# Patient Record
Sex: Female | Born: 1960 | Race: Black or African American | Hispanic: No | Marital: Married | Smoking: Former smoker
Health system: Southern US, Community
[De-identification: ages and names within clinical notes are randomized; demographics above are authoritative.]

---

## 2008-07-14 ENCOUNTER — Emergency Department: Payer: Self-pay | Admitting: Emergency Medicine

## 2008-10-02 ENCOUNTER — Emergency Department: Payer: Self-pay | Admitting: Emergency Medicine

## 2009-06-18 ENCOUNTER — Ambulatory Visit: Payer: Self-pay | Admitting: Obstetrics and Gynecology

## 2009-07-08 ENCOUNTER — Ambulatory Visit: Payer: Self-pay | Admitting: Obstetrics and Gynecology

## 2009-11-22 IMAGING — CR NECK SOFT TISSUES - 1+ VIEW
1 series · 2 of 2 positions shown · non-contrast
Comparison: none

REASON FOR EXAM: sore throat, difficulty swallowing
COMMENTS:

[Series 1: view not recorded · 0.17mm/px · 2 of 2 slices shown]
[im 1/2]
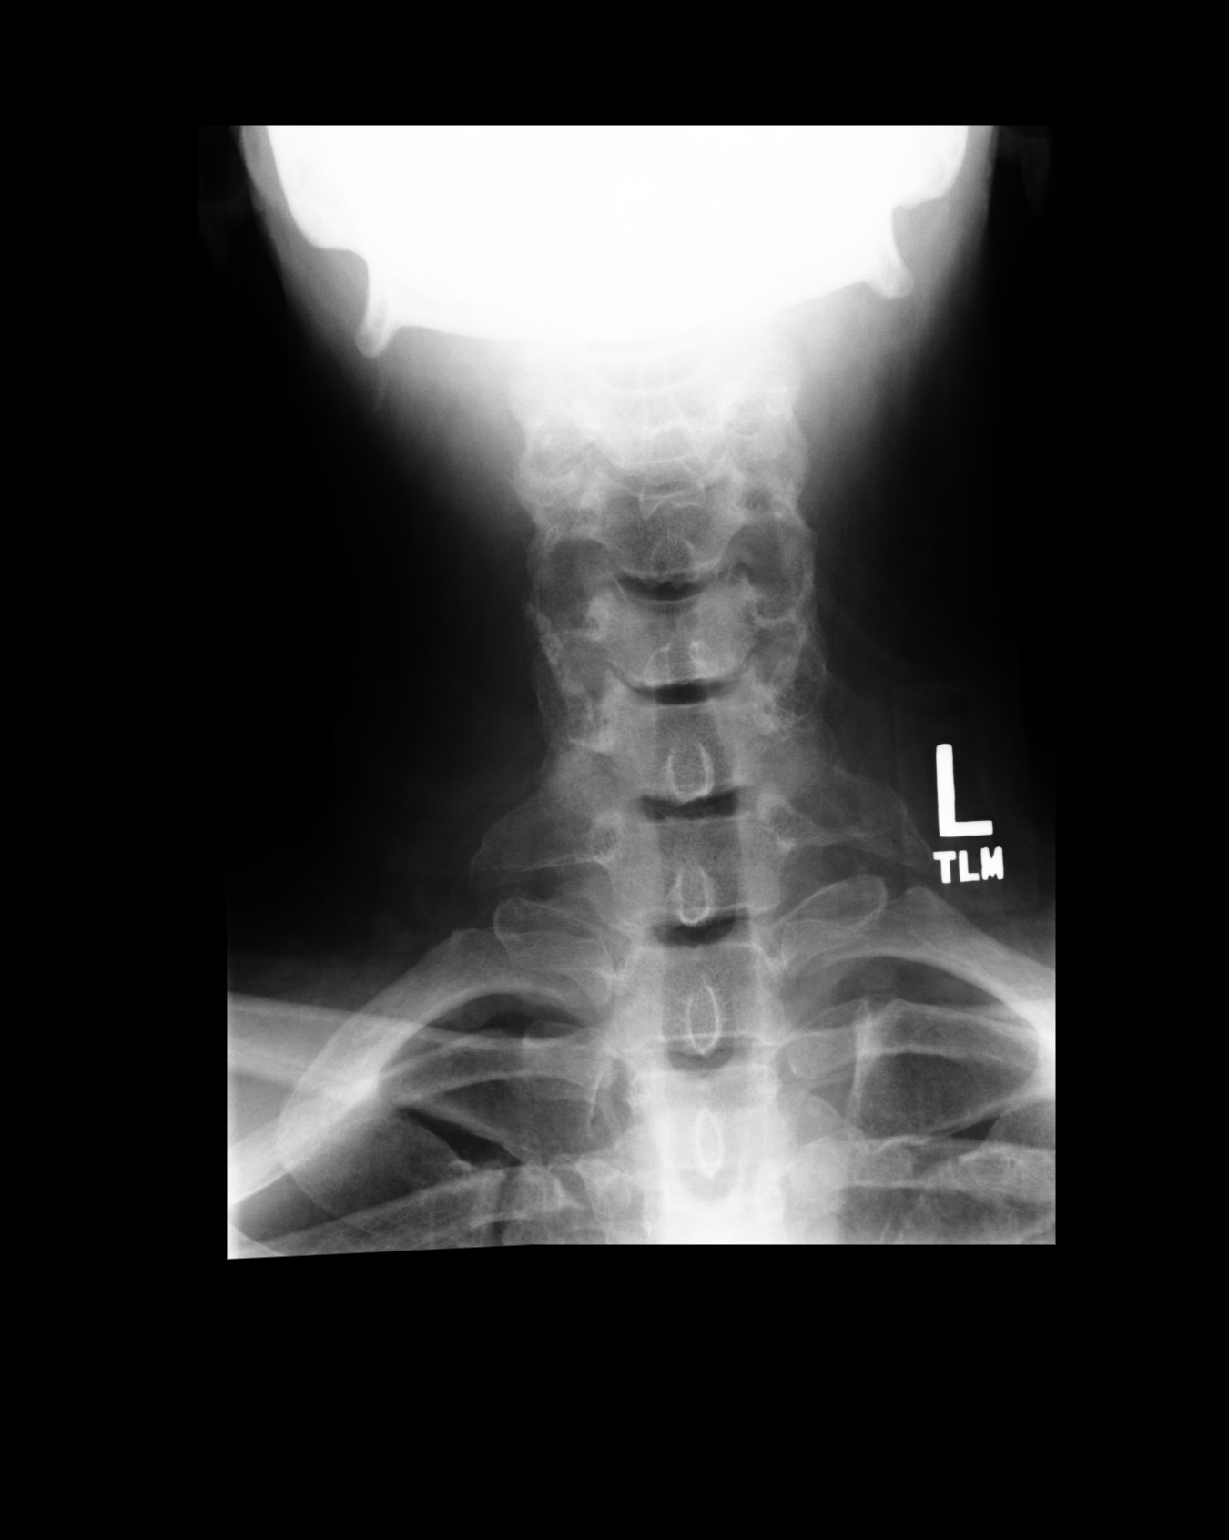
[im 2/2]
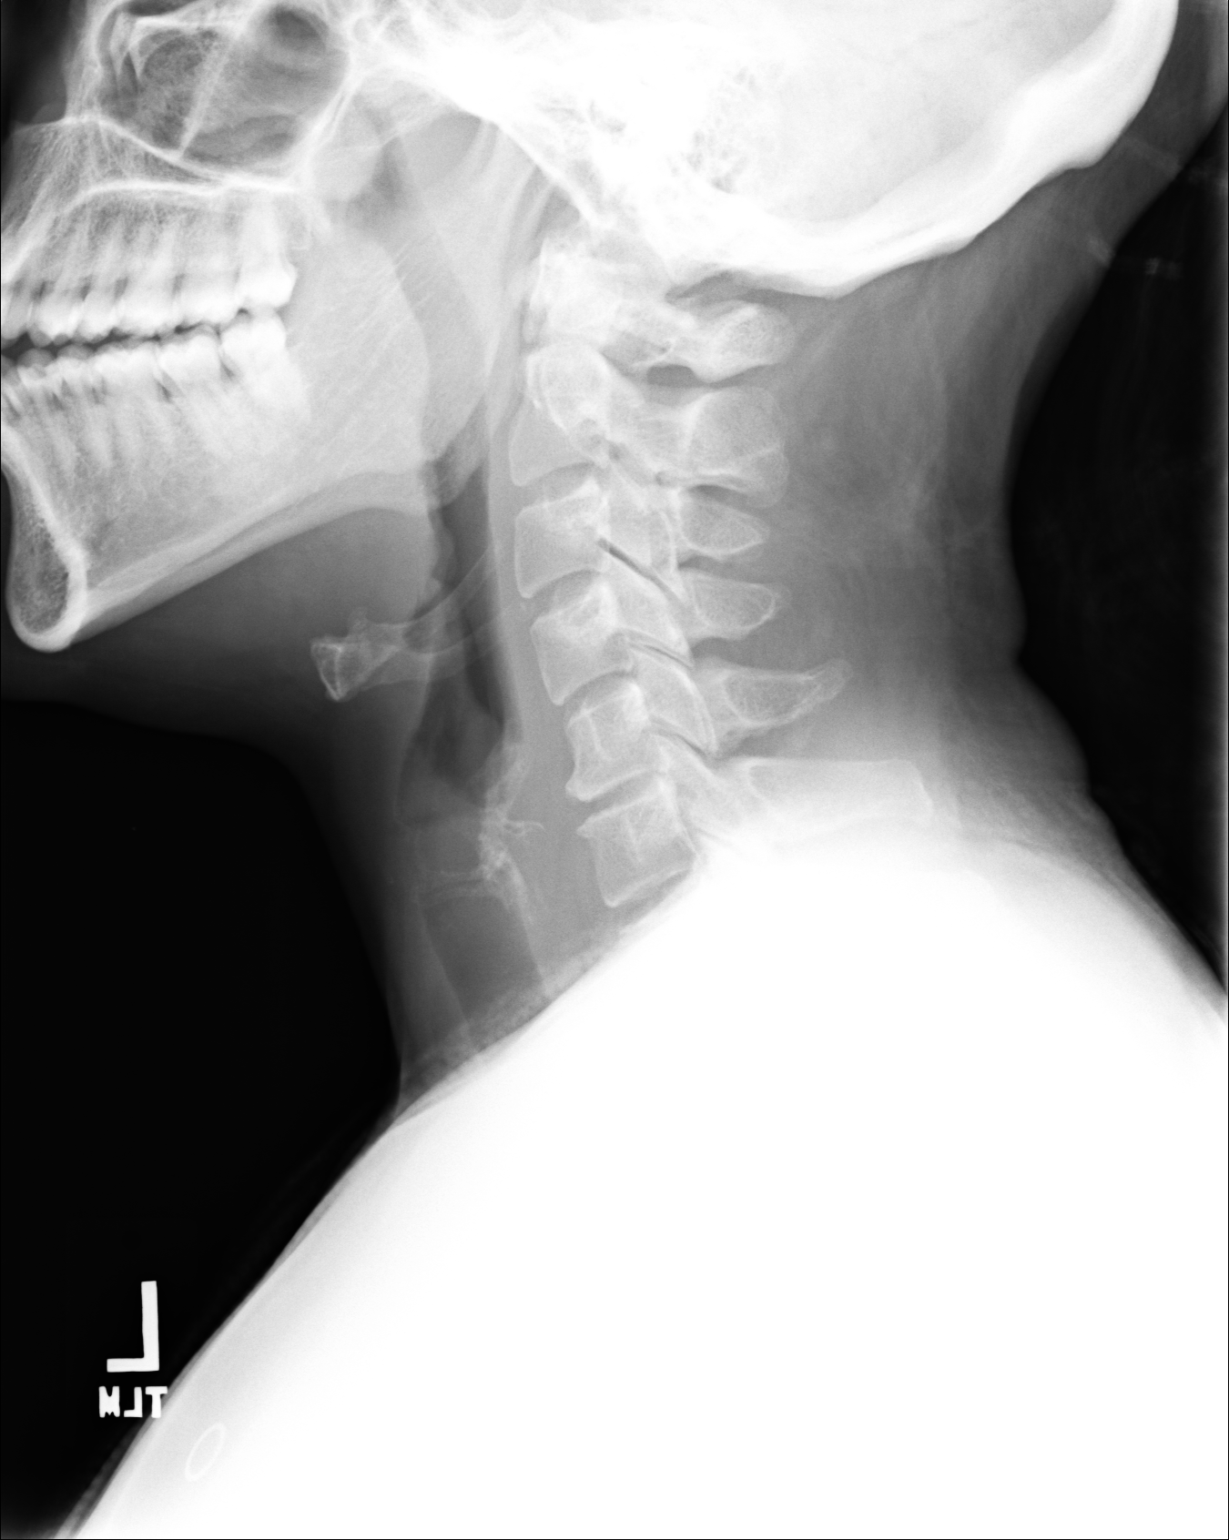

[2 of 2 positions shown; findings below may reference images not displayed]

PROCEDURE:     DXR - DXR SOFT TISSUE NECK  - July 14, 2008  [DATE]

RESULT:     There is no evidence of hypopharyngeal dilation or tracheal
narrowing.  No evidence of soft tissue masses is appreciated.  The
visualized bony skeleton demonstrates no evidence of fracture or
dislocation. There is no evidence of radiopaque foreign bodies.  No evidence
of epiglottical thickening is identified.
IMPRESSION: Unremarkable soft tissue neck evaluation as described above.

## 2011-10-15 ENCOUNTER — Emergency Department: Payer: Self-pay | Admitting: Emergency Medicine

## 2012-11-11 ENCOUNTER — Emergency Department: Payer: Self-pay | Admitting: Emergency Medicine

## 2014-01-13 ENCOUNTER — Emergency Department: Payer: Self-pay | Admitting: Emergency Medicine

## 2014-01-15 LAB — BETA STREP CULTURE(ARMC)

## 2017-12-20 ENCOUNTER — Encounter: Payer: Self-pay | Admitting: Physician Assistant

## 2017-12-20 ENCOUNTER — Emergency Department
Admission: EM | Admit: 2017-12-20 | Discharge: 2017-12-20 | Disposition: A | Discharge status: Home / Self Care | Payer: Self-pay | Attending: Emergency Medicine | Admitting: Emergency Medicine

## 2017-12-20 ENCOUNTER — Emergency Department: Payer: Self-pay

## 2017-12-20 ENCOUNTER — Other Ambulatory Visit: Payer: Self-pay

## 2017-12-20 DIAGNOSIS — Y998 Other external cause status: Secondary | ICD-10-CM | POA: Insufficient documentation

## 2017-12-20 DIAGNOSIS — S43102A Unspecified dislocation of left acromioclavicular joint, initial encounter: Secondary | ICD-10-CM | POA: Insufficient documentation

## 2017-12-20 DIAGNOSIS — Y9389 Activity, other specified: Secondary | ICD-10-CM | POA: Insufficient documentation

## 2017-12-20 DIAGNOSIS — W010XXA Fall on same level from slipping, tripping and stumbling without subsequent striking against object, initial encounter: Secondary | ICD-10-CM | POA: Insufficient documentation

## 2017-12-20 DIAGNOSIS — Y929 Unspecified place or not applicable: Secondary | ICD-10-CM | POA: Insufficient documentation

## 2017-12-20 MED ORDER — MELOXICAM 15 MG PO TABS: 15.0000 mg | ORAL_TABLET | Freq: Every day | ORAL | 2 refills | Status: AC

## 2017-12-20 MED ORDER — IBUPROFEN 800 MG PO TABS
800.0000 mg | ORAL_TABLET | Freq: Once | ORAL | Status: AC
  Administered 2017-12-20: 800 mg via ORAL
  Filled 2017-12-20: qty 1

## 2017-12-20 MED ORDER — TRAMADOL HCL 50 MG PO TABS: 50.0000 mg | ORAL_TABLET | Freq: Four times a day (QID) | ORAL | 0 refills | Status: AC | PRN

## 2017-12-20 MED ORDER — TRAMADOL HCL 50 MG PO TABS
50.0000 mg | ORAL_TABLET | Freq: Once | ORAL | Status: AC
  Administered 2017-12-20: 50 mg via ORAL
  Filled 2017-12-20: qty 1

## 2017-12-20 NOTE — ED Notes (Signed)
Pt requesting if she can have a stronger pain medicine before she leaves.

## 2017-12-20 NOTE — Discharge Instructions (Addendum)
Follow-up with Dr. Joice Lofts, call for an appointment.  Apply ice to the left shoulder as much as possible.  He can wear the sling for comfort but be sure to take your shoulder out of the sling and move it around so it will not get stiff.  If you are worsening please return the emergency department

## 2017-12-20 NOTE — ED Triage Notes (Signed)
Pt fell 3 weeks ago and injured left shoulder, states did not see md at the time. States since pain has been worsening, pain worse to left shoulder and left upper arm.

## 2017-12-21 NOTE — ED Provider Notes (Signed)
[facility] Emergency Department Provider Note  ____________________________________________   First MD Initiated Contact with Patient 12/20/17 1931     (approximate)  I have reviewed the triage vital signs and the nursing notes.   HISTORY  Chief Complaint Arm Injury    HPI Haley Lawrence is a 57 y.o. female presents emergency department after falling 3 weeks ago.  She states she has had left shoulder pain since she fell.  She denies any numbness or tingling.  She states she just cannot sleep on that side and it hurts to raise her arm.  She denies neck pain, chest pain, or any other injuries.  She did not lose consciousness at the time of her fall.  History reviewed. No pertinent past medical history.  There are no active problems to display for this patient.   History reviewed. No pertinent surgical history.  Prior to Admission medications   Medication Sig Start Date End Date Taking? Authorizing Provider  meloxicam (MOBIC) 15 MG tablet Take 1 tablet (15 mg total) by mouth daily. 12/20/17 12/20/18  Nicollette Wilhelmi, Roselyn Bering, PA-C  traMADol (ULTRAM) 50 MG tablet Take 1 tablet (50 mg total) by mouth every 6 (six) hours as needed. 12/20/17   Faythe Ghee, PA-C    Allergies Patient has no known allergies.  No family history on file.  Social History Social History   Tobacco Use  . Smoking status: Not on file  Substance Use Topics  . Alcohol use: Not on file  . Drug use: Not on file    Review of Systems  Constitutional: No fever/chills Eyes: No visual changes. ENT: No sore throat. Respiratory: Denies cough Genitourinary: Negative for dysuria. Musculoskeletal: Negative for back pain.  Positive for left shoulder pain after a fall Skin: Negative for rash.    ____________________________________________   PHYSICAL EXAM:  VITAL SIGNS: ED Triage Vitals  Enc Vitals Group     BP 12/20/17 1925 (!) 150/85     Pulse Rate 12/20/17 1925 97     Resp  12/20/17 1925 18     Temp 12/20/17 1925 98.2 F (36.8 C)     Temp Source 12/20/17 1925 Oral     SpO2 12/20/17 1925 100 %     Weight 12/20/17 1926 225 lb (102.1 kg)     Height 12/20/17 1926  (1.676 m)     Head Circumference --      Peak Flow --      Pain Score 12/20/17 1926 10     Pain Loc --      Pain Edu? --      Excl. in [place]? --     Constitutional: Alert and oriented. Well appearing and in no acute distress. Eyes: Conjunctivae are normal.  Head: Atraumatic. Nose: No congestion/rhinnorhea. Mouth/Throat: Mucous membranes are moist.   Cardiovascular: Normal rate, regular rhythm.  Heart sounds are normal Respiratory: Normal respiratory effort.  No retractions, lungs clear to auscultation GU: deferred Musculoskeletal: Decreased range of motion of the left shoulder.  Left shoulder is tender at the ACJ joint.  The left humerus is also tender.  She is neurovascularly intact. Neurologic:  Normal speech and language.  Skin:  Skin is warm, dry and intact. No rash noted. Psychiatric: Mood and affect are normal. Speech and behavior are normal.  ____________________________________________   LABS (all labs ordered are listed, but only abnormal results are displayed)  Labs Reviewed - No data to display ____________________________________________   ____________________________________________  RADIOLOGY  Cathlean Sauer  of the left shoulder does not show a fracture, it does show a mild AC separation  ____________________________________________   PROCEDURES  Procedure(s) performed: Sling was applied by the tech  Procedures    ____________________________________________   INITIAL IMPRESSION / ASSESSMENT AND PLAN / ED COURSE  Pertinent labs & imaging results that were available during my care of the patient were reviewed by me and considered in my medical decision making (see chart for details).  Patient is 57 year old female presents emergency department after a fall 3  weeks ago.  She has had continued left shoulder pain since the fall.  She is afraid that she has a broken bone.  On physical exam the ACJ joint is tender in the upper humerus is tender to palpation.  She also has decreased range of motion secondary to pain.  Remainder the exam is benign  X-ray of the left shoulder shows no bony fracture but a possible AC separation.  Spleen x-ray findings to the patient.  She was given a sling for comfort.  She is apply ice daily.  She is given a prescription for meloxicam 15 mg daily and Ultram 50 mg every 6 hours as needed, #15 no refill.  She is to follow-up with orthopedics.  She is to return to the emergency department if worsening.  She states she understands will comply with our instructions she was discharged in stable condition    As part of my medical decision making, I reviewed the following data within the electronic MEDICAL RECORD NUMBER Nursing notes reviewed and incorporated, Old chart reviewed, Radiograph reviewed x-ray of the left shoulder is negative for any acute fractures, questionable AC separation, Notes from prior ED visits and [place] Controlled Substance Database  ____________________________________________   FINAL CLINICAL IMPRESSION(S) / ED DIAGNOSES  Final diagnoses:  AC separation, left, initial encounter      NEW MEDICATIONS STARTED DURING THIS VISIT:  Discharge Medication List as of 12/20/2017  8:20 PM    START taking these medications   Details  meloxicam (MOBIC) 15 MG tablet Take 1 tablet (15 mg total) by mouth daily., Starting Mon 12/20/2017, Until Tue 12/20/2018, Print    traMADol (ULTRAM) 50 MG tablet Take 1 tablet (50 mg total) by mouth every 6 (six) hours as needed., Starting Mon 12/20/2017, Print         Note:  This document was prepared using Dragon voice recognition software and may include unintentional dictation errors.    Faythe Ghee, PA-C 12/21/17 6045    Phineas Semen, MD 12/21/17 [phone]

## 2019-04-30 IMAGING — CR DG SHOULDER 2+V*L*
3 series · 3 of 3 positions shown · non-contrast
Comparison: None.

CLINICAL DATA: Left shoulder pain after fall 3 weeks ago.

EXAM:
LEFT SHOULDER - 2+ VIEW

[shoulder y view]
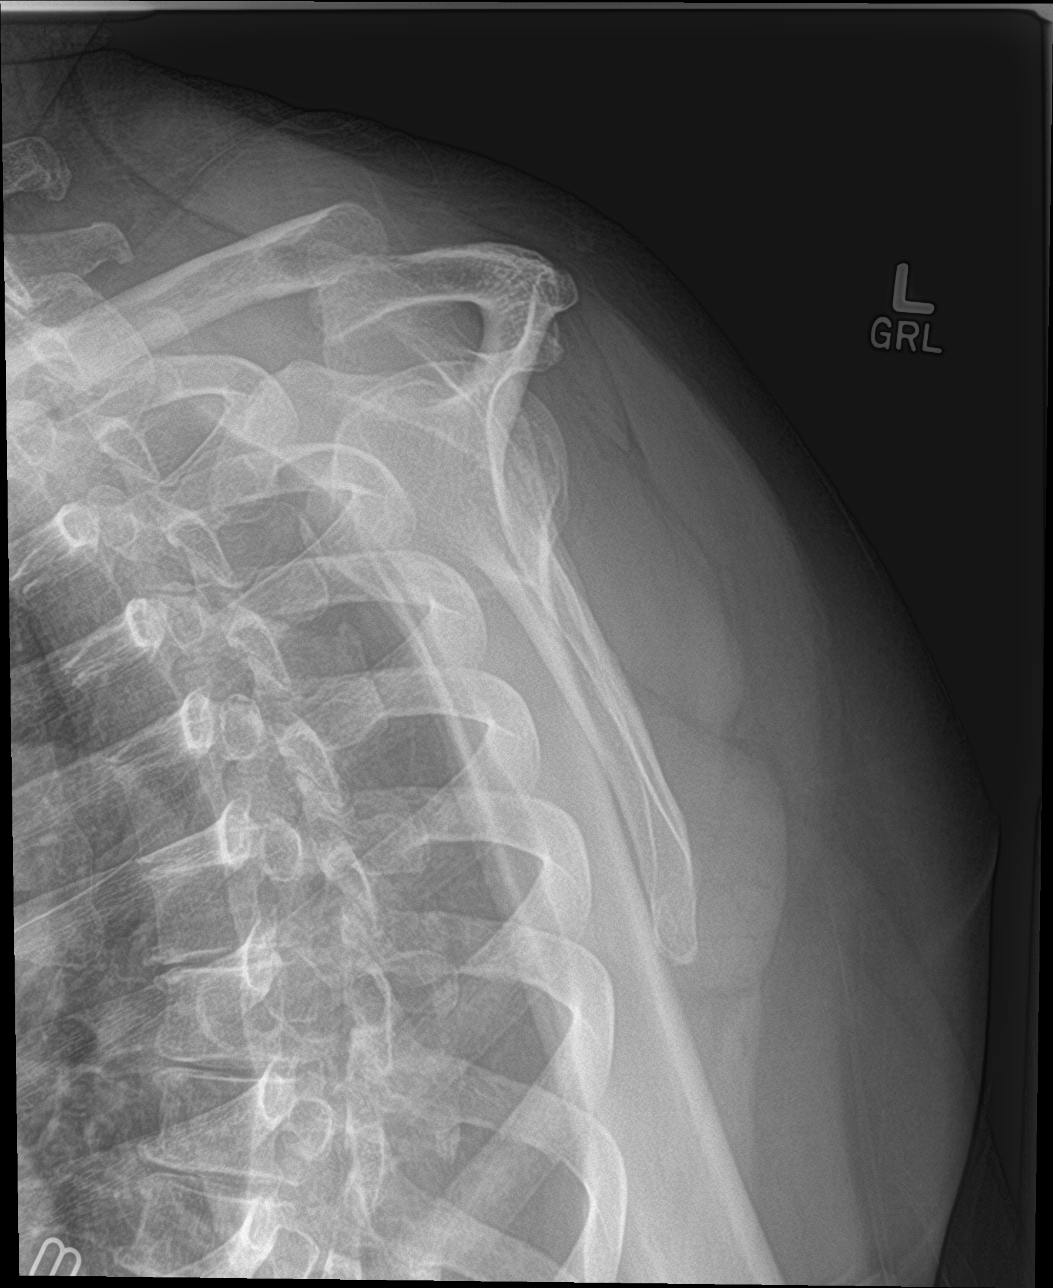

[shoulder axillary]
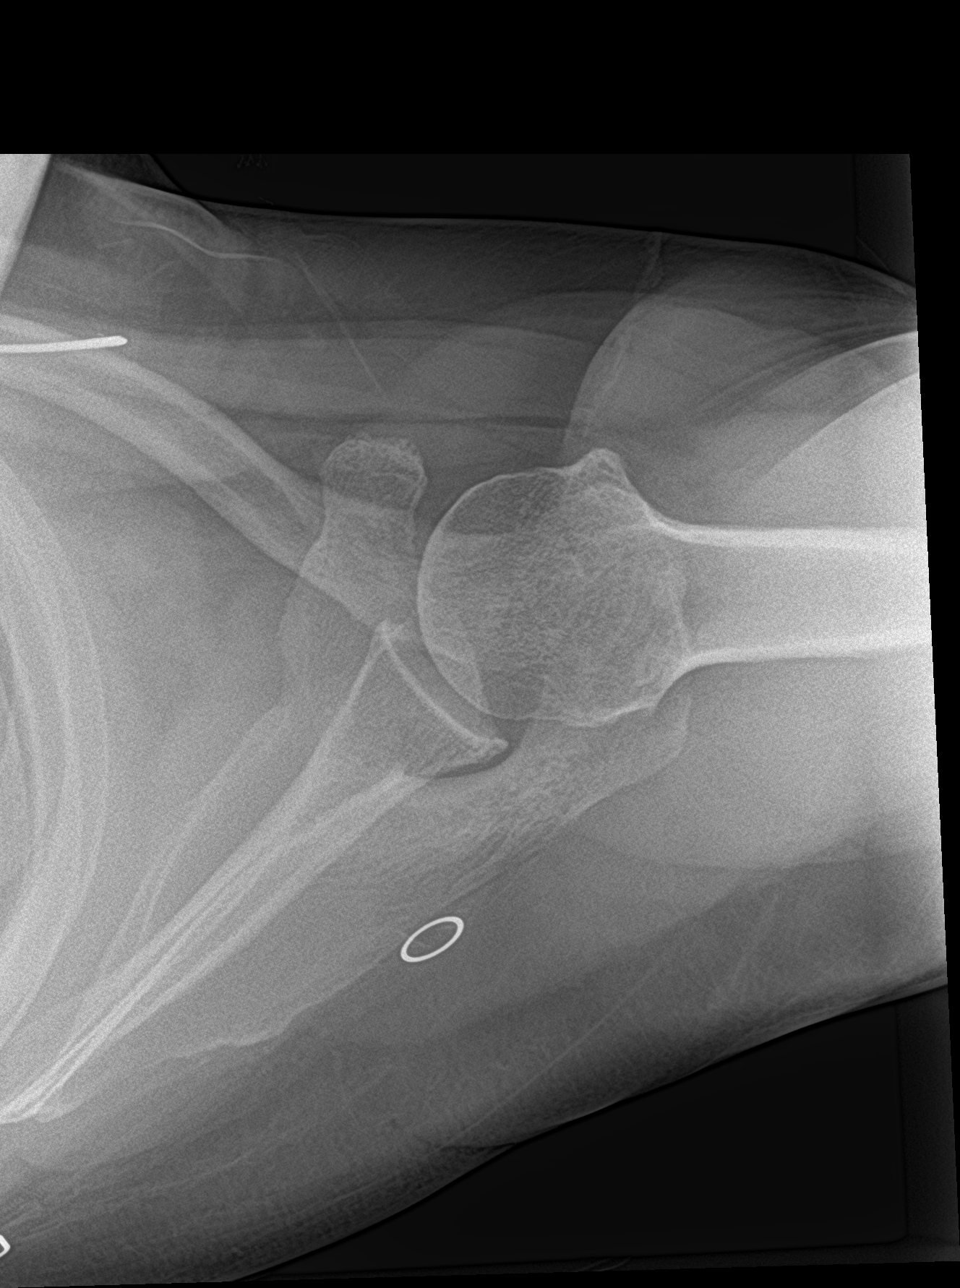

[shoulder grashey]
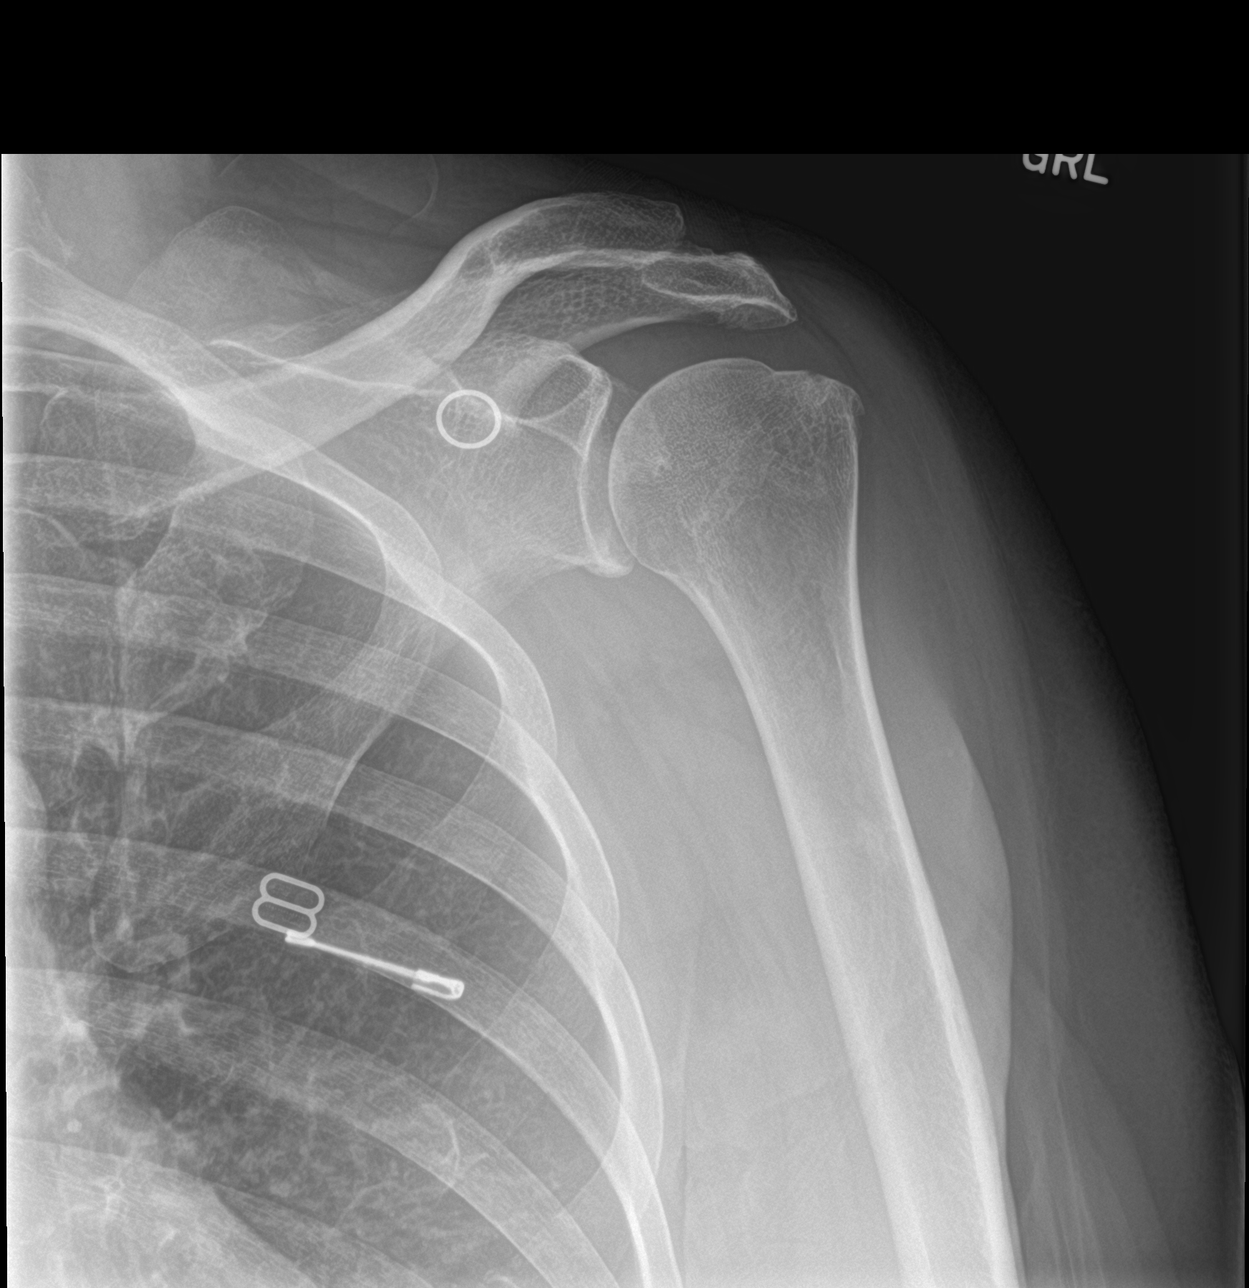

[3 of 3 positions shown; findings below may reference images not displayed]

FINDINGS: There is no evidence of fracture. The distal end of the left
clavicle is slightly elevated compared to the acromion suggesting
mild acromioclavicular joint separation. There is no evidence of
arthropathy. Soft tissues are unremarkable.
IMPRESSION: No definite fracture is noted. Possible mild acromioclavicular joint
separation.

## 2020-03-09 ENCOUNTER — Ambulatory Visit: Payer: Self-pay | Attending: Internal Medicine

## 2020-03-09 ENCOUNTER — Other Ambulatory Visit: Payer: Self-pay

## 2020-03-09 DIAGNOSIS — Z23 Encounter for immunization: Secondary | ICD-10-CM

## 2020-03-09 NOTE — Progress Notes (Signed)
° °  Covid-19 Vaccination Clinic  Name:  Haley Lawrence    MRN: [id number] DOB: 27-Mar-1961  03/09/2020  [name] was observed post Covid-19 immunization for 15 minutes without incident. She was provided with Vaccine Information Sheet and instruction to access the V-Safe system.   [name] was instructed to call 911 with any severe reactions post vaccine:  Difficulty breathing   Swelling of face and throat   A fast heartbeat   A bad rash all over body   Dizziness and weakness   Immunizations Administered    Name Date Dose VIS Date Route   Pfizer COVID-19 Vaccine 03/09/2020 10:59 AM 0.3 mL 10/04/2018 Intramuscular   Manufacturer: [employer], [employer]   Lot: [id number]   NDC: [address]-0

## 2020-04-01 ENCOUNTER — Ambulatory Visit: Payer: Self-pay | Attending: Internal Medicine

## 2020-04-01 DIAGNOSIS — Z23 Encounter for immunization: Secondary | ICD-10-CM

## 2020-04-01 NOTE — Progress Notes (Signed)
   Covid-19 Vaccination Clinic  Name:  Haley Lawrence    MRN: [id number] DOB: November 04, 1960  04/01/2020  [name] was observed post Covid-19 immunization for 15 minutes without incident. She was provided with Vaccine Information Sheet and instruction to access the V-Safe system.   [name] was instructed to call 911 with any severe reactions post vaccine: Marland Kitchen Difficulty breathing  . Swelling of face and throat  . A fast heartbeat  . A bad rash all over body  . Dizziness and weakness   Immunizations Administered    Name Date Dose VIS Date Route   Pfizer COVID-19 Vaccine 04/01/2020  9:14 AM 0.3 mL 10/04/2018 Intramuscular   Manufacturer: [employer], [employer]   Lot: [id number]   NDC: [address]-4

## 2020-07-03 ENCOUNTER — Encounter: Payer: Self-pay | Admitting: Emergency Medicine

## 2020-07-03 ENCOUNTER — Emergency Department
Admission: EM | Admit: 2020-07-03 | Discharge: 2020-07-03 | Disposition: A | Discharge status: Home / Self Care | Payer: BC Managed Care – PPO | Attending: Emergency Medicine | Admitting: Emergency Medicine

## 2020-07-03 ENCOUNTER — Emergency Department: Payer: BC Managed Care – PPO

## 2020-07-03 ENCOUNTER — Other Ambulatory Visit: Payer: Self-pay

## 2020-07-03 DIAGNOSIS — I10 Essential (primary) hypertension: Secondary | ICD-10-CM | POA: Insufficient documentation

## 2020-07-03 DIAGNOSIS — Z87891 Personal history of nicotine dependence: Secondary | ICD-10-CM | POA: Diagnosis not present

## 2020-07-03 DIAGNOSIS — R519 Headache, unspecified: Secondary | ICD-10-CM | POA: Insufficient documentation

## 2020-07-03 LAB — CBC
HCT: 38.4 % (ref 36.0–46.0)
Hemoglobin: 12.5 g/dL (ref 12.0–15.0)
MCH: 27.1 pg (ref 26.0–34.0)
MCHC: 32.6 g/dL (ref 30.0–36.0)
MCV: 83.3 fL (ref 80.0–100.0)
Platelets: 269 10*3/uL (ref 150–400)
RBC: 4.61 MIL/uL (ref 3.87–5.11)
RDW: 14.6 % (ref 11.5–15.5)
WBC: 4.9 10*3/uL (ref 4.0–10.5)
nRBC: 0 % (ref 0.0–0.2)

## 2020-07-03 LAB — BASIC METABOLIC PANEL
Anion gap: 8 (ref 5–15)
BUN: 16 mg/dL (ref 6–20)
CO2: 26 mmol/L (ref 22–32)
Calcium: 9.6 mg/dL (ref 8.9–10.3)
Chloride: 101 mmol/L (ref 98–111)
Creatinine, Ser: 0.87 mg/dL (ref 0.44–1.00)
GFR, Estimated: >60 mL/min (ref >60)
Glucose, Bld: 92 mg/dL (ref 70–99)
Potassium: 4.1 mmol/L (ref 3.5–5.1)
Sodium: 135 mmol/L (ref 135–145)

## 2020-07-03 MED ORDER — LACTATED RINGERS IV BOLUS: 1000.0000 mL | Freq: Once | INTRAVENOUS | Status: DC

## 2020-07-03 MED ORDER — LABETALOL HCL 5 MG/ML IV SOLN: 10.0000 mg | Freq: Once | INTRAVENOUS | Status: DC

## 2020-07-03 MED ORDER — AMLODIPINE BESYLATE 5 MG PO TABS
5.0000 mg | ORAL_TABLET | Freq: Once | ORAL | Status: AC
  Administered 2020-07-03: 5 mg via ORAL
  Filled 2020-07-03: qty 1

## 2020-07-03 MED ORDER — ACETAMINOPHEN 500 MG PO TABS
1000.0000 mg | ORAL_TABLET | Freq: Once | ORAL | Status: AC
  Administered 2020-07-03: 1000 mg via ORAL
  Filled 2020-07-03: qty 2

## 2020-07-03 MED ORDER — AMLODIPINE BESYLATE 5 MG PO TABS: 5.0000 mg | ORAL_TABLET | Freq: Every day | ORAL | 11 refills | Status: AC

## 2020-07-03 NOTE — ED Notes (Signed)
Patient transported to CT 

## 2020-07-03 NOTE — ED Notes (Signed)
X-ray at bedside

## 2020-07-03 NOTE — ED Triage Notes (Signed)
Pt comes into the ED via POV c/o HTN that has been fluctuating for a couple weeks.  Pt states she has never been diagnosed with HTN in the past.  Denies any CP or SHOB but she does admit to some dizziness and headaches.  Pt is A&Ox4 and in NAD with even and unlabored respirations.

## 2020-07-03 NOTE — ED Provider Notes (Signed)
[facility] Emergency Department Provider Note  ____________________________________________   First MD Initiated Contact with Patient 07/03/20 2004     (approximate)  I have reviewed the triage vital signs and the nursing notes.   HISTORY  Chief Complaint Hypertension   HPI Haley Lawrence is a 59 y.o. female without significant past medical history who presents for assessment with concern for elevated blood pressure.  Patient states she has been managing her blood pressure intermittently over the last couple of weeks and that the last 2 weeks especially has been elevated with systolics measured in the 160s and 70s.  Patient denies any history of high blood pressure and is not currently taking any medicine for this.  She states that she has intermittently felt lightheaded and sometimes has a little bit of a headache including today on presentation emergency room but otherwise has no acute symptoms/she specifically denies any vertigo, vision changes, chest pain, shortness of breath, cough, nausea, vomiting, diarrhea, dysuria, balance problems, abdominal pain, back pain, rash, extremity pain, extremity weakness numbness or tingling, or other acute complaints.  She denies EtOH use illicit drug use, tobacco abuse.  She states she only drinks 3 cups of coffee per week.  No clear precipitating events or factors.         History reviewed. No pertinent past medical history.  There are no problems to display for this patient.   Past Surgical History:  Procedure Laterality Date  . CESAREAN SECTION      Prior to Admission medications   Medication Sig Start Date End Date Taking? Authorizing Provider  amLODipine (NORVASC) 5 MG tablet Take 1 tablet (5 mg total) by mouth daily. 07/03/20 07/03/21  Gilles Chiquito, MD  traMADol (ULTRAM) 50 MG tablet Take 1 tablet (50 mg total) by mouth every 6 (six) hours as needed. 12/20/17   Faythe Ghee, PA-C     Allergies Patient has no known allergies.  History reviewed. No pertinent family history.  Social History Social History   Tobacco Use  . Smoking status: Former Games developer  . Smokeless tobacco: Never Used  Substance Use Topics  . Alcohol use: Not Currently  . Drug use: Never    Review of Systems  Review of Systems  Constitutional: Negative for chills and fever.  HENT: Negative for sore throat.   Eyes: Negative for pain.  Respiratory: Negative for cough and stridor.   Cardiovascular: Negative for chest pain.  Gastrointestinal: Negative for vomiting.  Skin: Negative for rash.  Neurological: Negative for seizures, loss of consciousness and headaches.  Psychiatric/Behavioral: Negative for suicidal ideas.  All other systems reviewed and are negative.     ____________________________________________   PHYSICAL EXAM:  VITAL SIGNS: ED Triage Vitals  Enc Vitals Group     BP 07/03/20 1625 (!) 172/105     Pulse Rate 07/03/20 1625 79     Resp --      Temp 07/03/20 1625 98.1 F (36.7 C)     Temp Source 07/03/20 1625 Oral     SpO2 07/03/20 1625 99 %     Weight 07/03/20 1627 225 lb (102.1 kg)     Height 07/03/20 1627 5\' 11"  (1.803 m)     Head Circumference --      Peak Flow --      Pain Score 07/03/20 1630 0     Pain Loc --      Pain Edu? --      Excl. in [place]? --  Vitals:   07/03/20 1625 07/03/20 2022  BP: (!) 172/105 (!) 154/80  Pulse: 79 77  Resp:  18  Temp: 98.1 F (36.7 C)   SpO2: 99% 99%   Physical Exam Vitals and nursing note reviewed.  Constitutional:      General: She is not in acute distress.    Appearance: She is well-developed.  HENT:     Head: Normocephalic and atraumatic.     Right Ear: External ear normal.     Left Ear: External ear normal.     Nose: Nose normal.     Mouth/Throat:     Mouth: Mucous membranes are moist.  Eyes:     Conjunctiva/sclera: Conjunctivae normal.  Cardiovascular:     Rate and Rhythm: Normal rate and regular  rhythm.     Heart sounds: No murmur heard.   Pulmonary:     Effort: Pulmonary effort is normal. No respiratory distress.     Breath sounds: Normal breath sounds.  Abdominal:     Palpations: Abdomen is soft.     Tenderness: There is no abdominal tenderness.  Musculoskeletal:     Cervical back: Neck supple.  Skin:    General: Skin is warm and dry.     Capillary Refill: Capillary refill takes less than 2 seconds.  Neurological:     Mental Status: She is alert and oriented to person, place, and time.  Psychiatric:        Mood and Affect: Mood normal.     Cranial nerves II through XII grossly intact.  No pronator drift.  No finger dysmetria.  Symmetric 5/5 strength of all extremities.  Sensation intact to light touch in all extremities.  Unremarkable unassisted gait. ____________________________________________   LABS (all labs ordered are listed, but only abnormal results are displayed)  Labs Reviewed  BASIC METABOLIC PANEL  CBC   ____________________________________________  EKG  Sinus rhythm with a ventricular rate of 71, normal axis, unremarkable intervals,nonspecific ST change in lead III without other evidence of acute ischemia or other significant electrolyte. ____________________________________________  RADIOLOGY  ED MD interpretation: CT head unremarkable for evidence of acute intracranial abnormality.  Chest x-ray unremarkable for evidence of pulmonary marked heart failure.  No other acute thoracic process on chest x-ray.  Official radiology report(s): CT Head Wo Contrast  Result Date: 07/03/2020 CLINICAL DATA:  Headache hypertension EXAM: CT HEAD WITHOUT CONTRAST TECHNIQUE: Contiguous axial images were obtained from the base of the skull through the vertex without intravenous contrast. COMPARISON:  Report 07/17/2003 FINDINGS: Brain: No evidence of acute infarction, hemorrhage, hydrocephalus, extra-axial collection or mass lesion/mass effect. Vascular: No hyperdense  vessel or unexpected calcification. Skull: Normal. Negative for fracture or focal lesion. Sinuses/Orbits: No acute finding. Other: None IMPRESSION: Negative non contrasted CT appearance of the brain. Electronically Signed   By: Jasmine Pang M.D.   On: 07/03/2020 20:41   DG Chest Portable 1 View  Result Date: 07/03/2020 CLINICAL DATA:  Symptomatic hypertension. EXAM: PORTABLE CHEST 1 VIEW COMPARISON:  None. FINDINGS: The heart size and mediastinal contours are within normal limits. Both lungs are clear. The visualized skeletal structures are unremarkable. IMPRESSION: No active disease. Electronically Signed   By: Aram Candela M.D.   On: 07/03/2020 20:24    ____________________________________________   PROCEDURES  Procedure(s) performed (including Critical Care):  .1-3 Lead EKG Interpretation Performed by: Gilles Chiquito, MD Authorized by: Gilles Chiquito, MD     Interpretation: normal     ECG rate assessment: normal  Rhythm: sinus rhythm     Ectopy: none     Conduction: normal       ____________________________________________   INITIAL IMPRESSION / ASSESSMENT AND PLAN / ED COURSE        Patient presents with above history and exam for assessment of elevated blood pressure seen on several readings of last couple days at home as well as some intermittent headaches and lightheadedness.  Patient states she has a mild headache today coming to the emergency room.  She denies any recent falls or injuries and is not on any blood thinners.  She is noted be hypertensive with a BP of 172/105 otherwise stable vital signs on room air.  Overall impression is likely some mild hypertensive urgency.  No focal deficits or findings on head CT to suggest CVA or intracranial hemorrhage.  Low suspicion for ACS given patient and only denies any chest pain or shortness of breath..  She has no evidence of kidney injury and BMP otherwise shows no significant ocular metabolic derangements.   CBC is unremarkable.  Chest x-ray shows no evidence of acute heart failure.  Patient given  amlodipine in ED with improvement in her blood pressure to 154/80.  In addition she stated her headache had improved on my reassessment.    Given otherwise stable vital signs and reassuring work-up and exam with improvement blood pressure and symptoms I believe she is safe for discharge with plan for close outpatient PCP follow-up.  Rx written for amlodipine.  Discharge stable condition.  Strict precautions advised discussed.   ____________________________________________   FINAL CLINICAL IMPRESSION(S) / ED DIAGNOSES  Final diagnoses:  Hypertension, unspecified type    Medications  amLODipine (NORVASC) tablet 5 mg (has no administration in time range)  acetaminophen (TYLENOL) tablet 1,000 mg (has no administration in time range)     ED Discharge Orders         Ordered    amLODipine (NORVASC) 5 MG tablet  Daily        07/03/20 2047           Note:  This document was prepared using Dragon voice recognition software and may include unintentional dictation errors.   Gilles Chiquito, MD 07/03/20 2049

## 2020-07-03 NOTE — ED Notes (Signed)
Discharge instructions reviewed with patient. Pt calm , collective, denied pain or sob upon discharge

## 2021-11-11 IMAGING — DX DG CHEST 1V PORT
1 series · 1 of 1 positions shown · non-contrast
Comparison: None.

CLINICAL DATA: Symptomatic hypertension.

EXAM:
PORTABLE CHEST 1 VIEW

[chest ap]
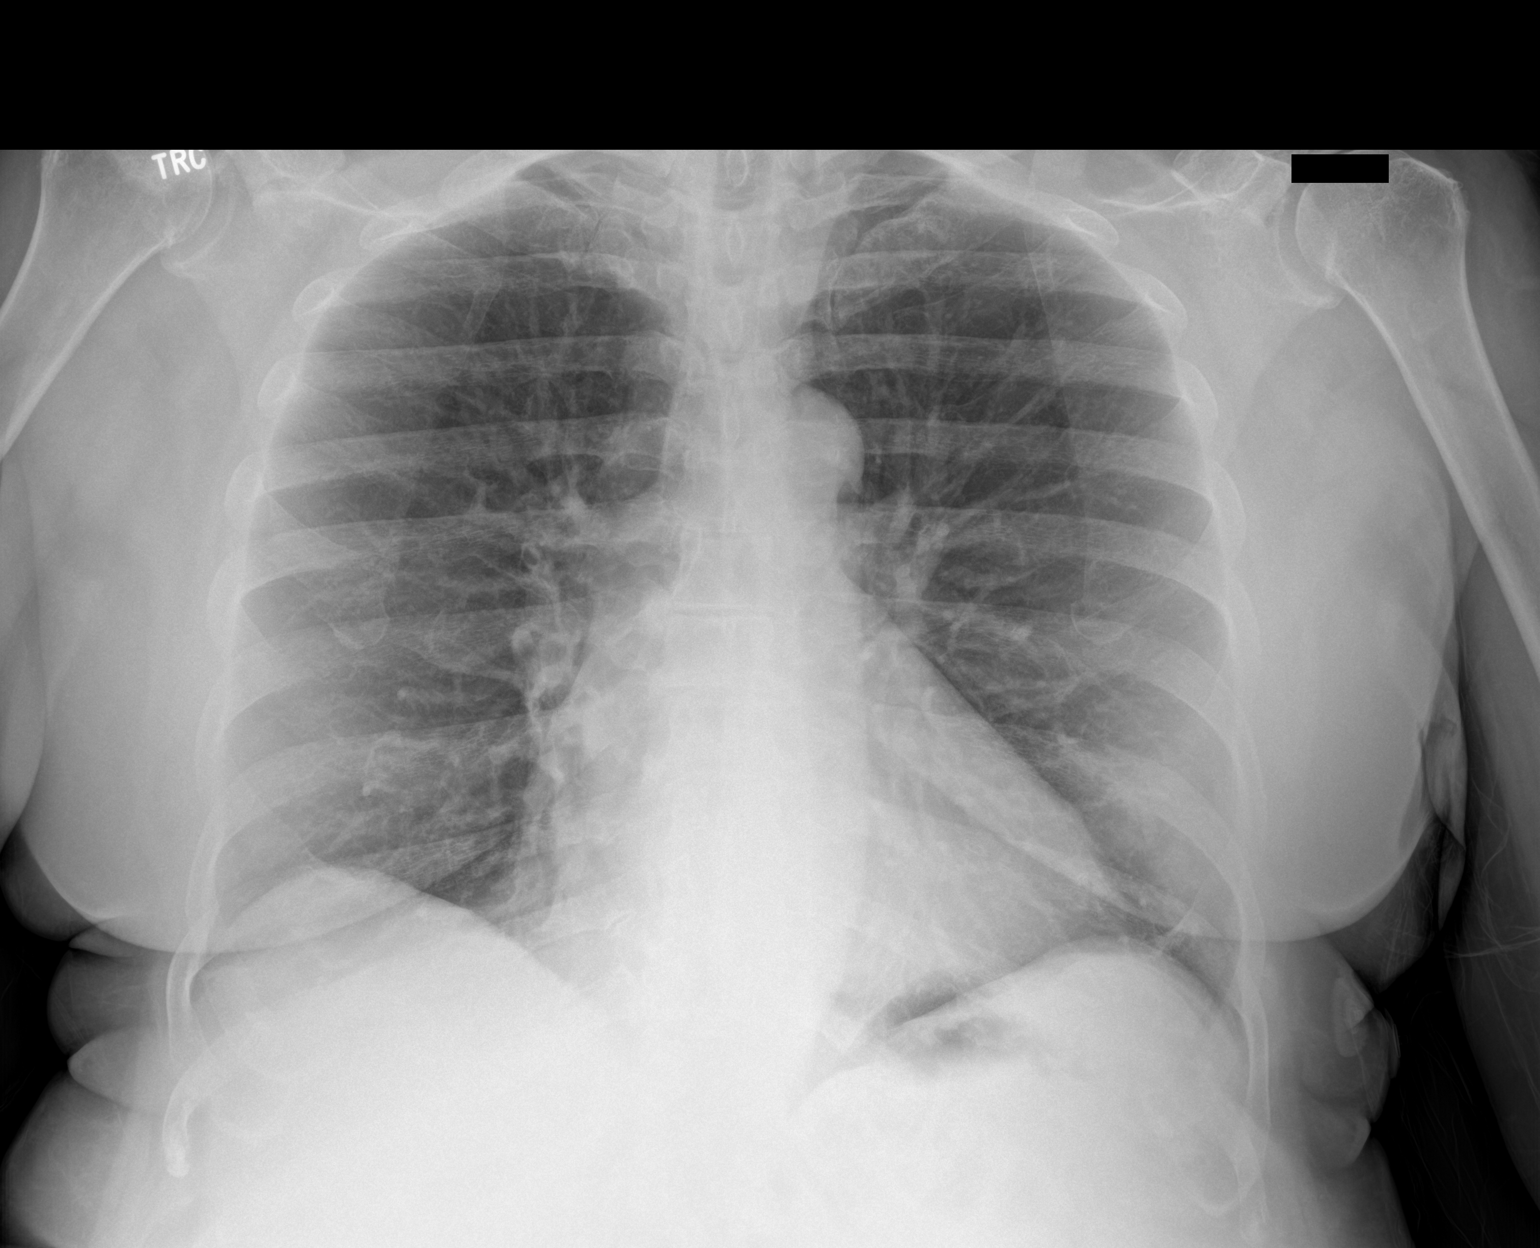

[1 of 1 positions shown; findings below may reference images not displayed]

FINDINGS: The heart size and mediastinal contours are within normal limits.
Both lungs are clear. The visualized skeletal structures are
unremarkable.
IMPRESSION: No active disease.

## 2022-09-16 ENCOUNTER — Emergency Department: Payer: BC Managed Care – PPO

## 2022-09-16 ENCOUNTER — Emergency Department
Admission: EM | Admit: 2022-09-16 | Discharge: 2022-09-16 | Disposition: A | Discharge status: Home / Self Care | Payer: BC Managed Care – PPO | Attending: Emergency Medicine | Admitting: Emergency Medicine

## 2022-09-16 DIAGNOSIS — S93402A Sprain of unspecified ligament of left ankle, initial encounter: Secondary | ICD-10-CM | POA: Diagnosis not present

## 2022-09-16 DIAGNOSIS — S0990XA Unspecified injury of head, initial encounter: Secondary | ICD-10-CM | POA: Diagnosis not present

## 2022-09-16 DIAGNOSIS — W01198A Fall on same level from slipping, tripping and stumbling with subsequent striking against other object, initial encounter: Secondary | ICD-10-CM | POA: Diagnosis not present

## 2022-09-16 DIAGNOSIS — W19XXXA Unspecified fall, initial encounter: Secondary | ICD-10-CM

## 2022-09-16 DIAGNOSIS — M25572 Pain in left ankle and joints of left foot: Secondary | ICD-10-CM | POA: Diagnosis present

## 2022-09-16 NOTE — ED Provider Notes (Signed)
   [facility] Provider Note    Event Date/Time   First MD Initiated Contact with Patient 09/16/22 [phone]     (approximate)   History   Fall, Head Injury, Leg Pain, and Shoulder Injury   HPI  Haley Lawrence is a 62 y.o. female who presents after a mechanical fall.  Patient reports head injury, mild shoulder injury as well as left ankle pain.  She is not on blood thinners.     Physical Exam   Triage Vital Signs: ED Triage Vitals [09/16/22 0930]  Enc Vitals Group     BP (!) 147/90     Pulse Rate 91     Resp 16     Temp 98.3 F (36.8 C)     Temp Source Oral     SpO2 96 %     Weight 100.7 kg (222 lb)     Height 1.727 m (5\' 8" )     Head Circumference      Peak Flow      Pain Score 7     Pain Loc      Pain Edu?      Excl. in [place]?     Most recent vital signs: Vitals:   09/16/22 0930  BP: (!) 147/90  Pulse: 91  Resp: 16  Temp: 98.3 F (36.8 C)  SpO2: 96%     General: Awake, no distress.  CV:  Good peripheral perfusion.  Resp:  Normal effort.  Abd:  No distention.  Soft, nontender Other:  Scalp: Patient's weave was cut away after obtaining permission from her, hematoma with mild skin break, no indication for repair Extremities: Left ankle with mild lateral malleolus swelling, no pain with axial load.  Otherwise reassuring exam   ED Results / Procedures / Treatments   Labs (all labs ordered are listed, but only abnormal results are displayed) Labs Reviewed - No data to display   EKG     RADIOLOGY Ankle x-ray viewed interpreted by me, no fracture CT head without acute abnormality   PROCEDURES:  Critical Care performed:   Procedures   MEDICATIONS ORDERED IN ED: Medications - No data to display   IMPRESSION / MDM / [place] / ED COURSE  I reviewed the triage vital signs and the nursing notes. Patient's presentation is most consistent with acute presentation with potential threat to life or bodily  function.  Patient presents after a fall with head injury.  Sent for CT head given significant mechanism, hematoma.  CT is reassuring, no indication for repair, wound cleansed  X-ray negative for fracture, Ace wrap applied, outpatient follow-up as needed        FINAL CLINICAL IMPRESSION(S) / ED DIAGNOSES   Final diagnoses:  Injury of head, initial encounter  Fall, initial encounter  Sprain of left ankle, unspecified ligament, initial encounter     Rx / DC Orders   ED Discharge Orders     None        Note:  This document was prepared using Dragon voice recognition software and may include unintentional dictation errors.   Lavonia Drafts, MD 09/16/22 1452

## 2022-09-16 NOTE — ED Triage Notes (Signed)
Pt states that she missed a step this am and fell, pt reports that she hit the left side of her head and caused bleeding, left shoulder and left elbow and left leg pain, pt denies loc states that she was dizzy initially denies using blood thinners, pt states that she was able to get herself up

## 2023-11-13 ENCOUNTER — Ambulatory Visit
Admission: EM | Admit: 2023-11-13 | Discharge: 2023-11-13 | Disposition: A | Discharge status: Home / Self Care | Attending: Family Medicine | Admitting: Family Medicine

## 2023-11-13 ENCOUNTER — Encounter: Payer: Self-pay | Admitting: Emergency Medicine

## 2023-11-13 DIAGNOSIS — W57XXXA Bitten or stung by nonvenomous insect and other nonvenomous arthropods, initial encounter: Secondary | ICD-10-CM | POA: Diagnosis not present

## 2023-11-13 DIAGNOSIS — S30861A Insect bite (nonvenomous) of abdominal wall, initial encounter: Secondary | ICD-10-CM

## 2023-11-13 MED ORDER — DOXYCYCLINE HYCLATE 100 MG PO TABS: 200.0000 mg | ORAL_TABLET | Freq: Once | ORAL | 0 refills | Status: AC

## 2023-11-13 MED ORDER — DOXYCYCLINE HYCLATE 100 MG PO TABS
200.0000 mg | ORAL_TABLET | Freq: Two times a day (BID) | ORAL | 0 refills | Status: DC
Start: 2023-11-13 — End: 2023-11-13

## 2023-11-13 NOTE — Discharge Instructions (Addendum)
 Take doxycycline as prescribed.  Please monitor your symptoms and if you develop any fevers, chills, rashes/bull's-eye rash please seek reevaluation ASAP.  Follow-up with your PCP 2 to 3 days for recheck.  Please go to the ER for any worsening symptoms.  Hope you feel better soon!

## 2023-11-13 NOTE — ED Triage Notes (Signed)
 Patient states that she removed a tick from her right side of her abdomen yesterday.  Patient denies fevers.

## 2023-11-13 NOTE — ED Provider Notes (Addendum)
 [facility]    CSN: [id number] Arrival date & time: 11/13/23  1419      History   Chief Complaint Chief Complaint  Patient presents with   Insect Bite    tick    HPI Haley Lawrence is a 63 y.o. female presents for evaluation of a tick bite. Pt states yesterday she noticed a no non engorged tick to the right side of abdomin. States it was not there that morning. She removed the tick with the head intact. She does not know what type of tick it was. She reports some redness to the area but denies bullseye rash, fevers, or chills. No other concerns at this time.   HPI  History reviewed. No pertinent past medical history.  There are no active problems to display for this patient.   Past Surgical History:  Procedure Laterality Date   CESAREAN SECTION      OB History   No obstetric history on file.      Home Medications    Prior to Admission medications   Medication Sig Start Date End Date Taking? Authorizing Provider  hydrochlorothiazide (HYDRODIURIL) 12.5 MG tablet Take by mouth. 05/07/23  Yes [provider]  lamoTRIgine (LAMICTAL) 150 MG tablet Take 1 tablet by mouth 2 (two) times daily. 02/12/23  Yes [provider]  pantoprazole (PROTONIX) 40 MG tablet Take by mouth. 10/26/23 10/25/24 Yes [provider]  Vitamin D, Ergocalciferol, (DRISDOL) 1.25 MG (50000 UNIT) CAPS capsule Take 50,000 Units by mouth once a week. 10/27/23  Yes [provider]  amLODipine (NORVASC) 5 MG tablet Take 1 tablet (5 mg total) by mouth daily. 07/03/20 07/03/21  Gilles Chiquito, MD  doxycycline (VIBRA-TABS) 100 MG tablet Take 2 tablets (200 mg total) by mouth once for 1 dose. 11/13/23 11/13/23  Radford Pax, NP  traMADol (ULTRAM) 50 MG tablet Take 1 tablet (50 mg total) by mouth every 6 (six) hours as needed. 12/20/17   Sherrie Mustache Roselyn Bering, PA-C    Family History History reviewed. No pertinent family history.  Social History Social History   Tobacco  Use   Smoking status: Former   Smokeless tobacco: Never  Advertising account planner   Vaping status: Never Used  Substance Use Topics   Alcohol use: Not Currently   Drug use: Never     Allergies   Patient has no known allergies.   Review of Systems Review of Systems  Skin:        Tick bite     Physical Exam Triage Vital Signs ED Triage Vitals  Encounter Vitals Group     BP 11/13/23 1434 134/83     Systolic BP Percentile --      Diastolic BP Percentile --      Pulse Rate 11/13/23 1434 85     Resp 11/13/23 1434 14     Temp 11/13/23 1434 98.2 F (36.8 C)     Temp Source 11/13/23 1434 Oral     SpO2 11/13/23 1434 95 %     Weight 11/13/23 1431 222 lb 0.1 oz (100.7 kg)     Height 11/13/23 1431 5\' 8"  (1.727 m)     Head Circumference --      Peak Flow --      Pain Score 11/13/23 1431 0     Pain Loc --      Pain Education --      Exclude from Growth Chart --    No data found.  Updated Vital  Signs BP 134/83 (BP Location: Left Arm)   Pulse 85   Temp 98.2 F (36.8 C) (Oral)   Resp 14   Ht 5\' 8"  (1.727 m)   Wt 222 lb 0.1 oz (100.7 kg)   SpO2 95%   BMI 33.76 kg/m   Visual Acuity Right Eye Distance:   Left Eye Distance:   Bilateral Distance:    Right Eye Near:   Left Eye Near:    Bilateral Near:     Physical Exam Vitals and nursing note reviewed.  Constitutional:      General: She is not in acute distress.    Appearance: Normal appearance. She is not ill-appearing.  HENT:     Head: Normocephalic and atraumatic.  Cardiovascular:     Rate and Rhythm: Normal rate.  Abdominal:       Comments: There is a single insect bite tot he right lateral abd wall. Mild erythema without rash or bullseye rash. Mildly TTP.   Skin:    General: Skin is warm and dry.  Neurological:     General: No focal deficit present.     Mental Status: She is alert and oriented to person, place, and time.  Psychiatric:        Mood and Affect: Mood normal.        Behavior: Behavior normal.       UC Treatments / Results  Labs (all labs ordered are listed, but only abnormal results are displayed) Labs Reviewed - No data to display  EKG   Radiology No results found.  Procedures Procedures (including critical care time)  Medications Ordered in UC Medications - No data to display  Initial Impression / Assessment and Plan / UC Course  I have reviewed the triage vital signs and the nursing notes.  Pertinent labs & imaging results that were available during my care of the patient were reviewed by me and considered in my medical decision making (see chart for details).     Reviewed exam and sx with patient. No red flags. As pt confirms bite <72 hours and was not engorged, will treat prophylactically with 1 dose of doxycycline.  Advised to monitor for any signs or symptoms of infection/bull's-eye rash and to follow-up with PCP in 2 to 3 days for recheck.  ER precautions reviewed and patient verbalized understanding. Final Clinical Impressions(s) / UC Diagnoses   Final diagnoses:  Tick bite of abdominal wall, initial encounter     Discharge Instructions      Take doxycycline as prescribed.  Please monitor your symptoms and if you develop any fevers, chills, rashes/bull's-eye rash please seek reevaluation ASAP.  Follow-up with your PCP 2 to 3 days for recheck.  Please go to the ER for any worsening symptoms.  Hope you feel better soon!    ED Prescriptions     Medication Sig Dispense Auth. Provider   doxycycline (VIBRA-TABS) 100 MG tablet Take 2 tablets (200 mg total) by mouth once for 1 dose. 2 tablet Radford Pax, NP      PDMP not reviewed this encounter.   Radford Pax, NP 11/13/23 1454    Radford Pax, NP 11/13/23 [phone]
